# Patient Record
Sex: Male | Born: 2005 | Race: Black or African American | Hispanic: No | Marital: Single | Smoking: Never smoker
Health system: Southern US, Community
[De-identification: ages and names within clinical notes are randomized; demographics above are authoritative.]

## PROBLEM LIST (undated history)

## (undated) DIAGNOSIS — J45909 Unspecified asthma, uncomplicated: Secondary | ICD-10-CM

---
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
## 2023-09-09 ENCOUNTER — Ambulatory Visit
Admission: RE | Admit: 2023-09-09 | Discharge: 2023-09-09 | Disposition: A | Discharge status: Home / Self Care | Source: Ambulatory Visit | Attending: Pediatrics | Admitting: Pediatrics

## 2023-09-09 ENCOUNTER — Other Ambulatory Visit: Payer: Self-pay | Admitting: Pediatrics

## 2023-09-09 ENCOUNTER — Ambulatory Visit
Admission: RE | Admit: 2023-09-09 | Discharge: 2023-09-09 | Disposition: A | Discharge status: Home / Self Care | Attending: Pediatrics | Admitting: Pediatrics

## 2023-09-09 DIAGNOSIS — R1013 Epigastric pain: Secondary | ICD-10-CM
